# Patient Record
Sex: Female | Born: 1995 | Race: White | Hispanic: No | Marital: Single | Smoking: Never smoker
Health system: Southern US, Community
[De-identification: ages and names within clinical notes are randomized; demographics above are authoritative.]

## PROBLEM LIST (undated history)

## (undated) HISTORY — PX: TYMPANOSTOMY TUBE PLACEMENT: SHX32

---
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
## 2013-03-12 ENCOUNTER — Encounter (INDEPENDENT_AMBULATORY_CARE_PROVIDER_SITE_OTHER): Payer: 59 | Admitting: Family Medicine

## 2013-03-12 DIAGNOSIS — Z23 Encounter for immunization: Secondary | ICD-10-CM

## 2013-03-19 NOTE — Progress Notes (Signed)
Only here for a boostrix injection

## 2013-03-26 NOTE — Progress Notes (Signed)
  Subjective:    Patient ID: Connie Bernard, female    DOB: October 28, 1995, 17 y.o.   MRN: [id number]  HPI    Review of Systems     Objective:   Physical Exam        Assessment & Plan:  Erroneous

## 2013-11-22 ENCOUNTER — Ambulatory Visit (INDEPENDENT_AMBULATORY_CARE_PROVIDER_SITE_OTHER): Payer: 59 | Admitting: General Practice

## 2013-11-22 VITALS — BP 138/74 | HR 63 | Temp 98.4°F | Ht 65.5 in | Wt 175.0 lb

## 2013-11-22 DIAGNOSIS — N390 Urinary tract infection, site not specified: Secondary | ICD-10-CM

## 2013-11-22 DIAGNOSIS — B373 Candidiasis of vulva and vagina: Secondary | ICD-10-CM

## 2013-11-22 DIAGNOSIS — R350 Frequency of micturition: Secondary | ICD-10-CM

## 2013-11-22 DIAGNOSIS — B3731 Acute candidiasis of vulva and vagina: Secondary | ICD-10-CM

## 2013-11-22 LAB — POCT URINALYSIS DIPSTICK
Bilirubin, UA: NEGATIVE
Blood, UA: NEGATIVE
Glucose, UA: NEGATIVE
Ketones, UA: NEGATIVE
Nitrite, UA: NEGATIVE
Protein, UA: NEGATIVE
Spec Grav, UA: 1.025
Urobilinogen, UA: NEGATIVE
pH, UA: 5

## 2013-11-22 LAB — POCT UA - MICROSCOPIC ONLY
Bacteria, U Microscopic: NEGATIVE
Casts, Ur, LPF, POC: NEGATIVE
Crystals, Ur, HPF, POC: NEGATIVE
Mucus, UA: NEGATIVE
RBC, urine, microscopic: NEGATIVE

## 2013-11-22 MED ORDER — FLUCONAZOLE 150 MG PO TABS: 150.0000 mg | ORAL_TABLET | Freq: Once | ORAL | Status: DC

## 2013-11-22 MED ORDER — NITROFURANTOIN MONOHYD MACRO 100 MG PO CAPS: 100.0000 mg | ORAL_CAPSULE | Freq: Two times a day (BID) | ORAL | Status: DC

## 2013-11-22 NOTE — Progress Notes (Signed)
   Subjective:    Patient ID: Connie Bernard, female    DOB: 01/27/1996, 18 y.o.   MRN: [id number]  Urinary Tract Infection  This is a new problem. The current episode started in the past 7 days. The problem has been unchanged. The quality of the pain is described as aching and burning. The pain is at a severity of 4/10. There has been no fever. She is not sexually active. There is no history of pyelonephritis. Associated symptoms include frequency and urgency. Pertinent negatives include no chills or hematuria. There is no history of recurrent UTIs.  Reports last menstrual cycle began 3 weeks ago and regular.     Review of Systems  Constitutional: Negative for fever and chills.  Respiratory: Negative for chest tightness and shortness of breath.   Cardiovascular: Negative for chest pain and palpitations.  Genitourinary: Positive for dysuria, urgency and frequency. Negative for hematuria.       Objective:   Physical Exam  Constitutional: She is oriented to person, place, and time. She appears well-developed and well-nourished.  Cardiovascular: Normal rate, regular rhythm and normal heart sounds.   Pulmonary/Chest: Effort normal and breath sounds normal.  Abdominal: Soft. Bowel sounds are normal. She exhibits no distension. There is tenderness in the suprapubic area. There is no CVA tenderness.  Neurological: She is alert and oriented to person, place, and time.  Skin: Skin is warm and dry.  Psychiatric: She has a normal mood and affect.      Results for orders placed in visit on 11/22/13  POCT UA - MICROSCOPIC ONLY      Result Value Ref Range   WBC, Ur, HPF, POC 5-10     RBC, urine, microscopic neg     Bacteria, U Microscopic neg     Mucus, UA neg     Epithelial cells, urine per micros occ     Crystals, Ur, HPF, POC neg     Casts, Ur, LPF, POC neg     Yeast, UA mod    POCT URINALYSIS DIPSTICK      Result Value Ref Range   Color, UA yellow     Clarity, UA clear     Glucose, UA neg     Bilirubin, UA neg     Ketones, UA neg     Spec Grav, UA 1.025     Blood, UA neg     pH, UA 5.0     Protein, UA neg     Urobilinogen, UA negative     Nitrite, UA neg     Leukocytes, UA moderate (2+)         Assessment & Plan:  1. Urinary frequency  - POCT UA - Microscopic Only - POCT urinalysis dipstick  2. UTI (urinary tract infection)  - nitrofurantoin, macrocrystal-monohydrate, (MACROBID) 100 MG capsule; Take 1 capsule (100 mg total) by mouth 2 (two) times daily.  Dispense: 20 capsule; Refill: 0  3. Vulvovaginal candidiasis  - fluconazole (DIFLUCAN) 150 MG tablet; Take 1 tablet (150 mg total) by mouth once.  Dispense: 1 tablet; Refill: 0 -Increase fluid intake AZO over the counter X2 days Frequent voiding Proper perineal hygiene RTO prn Patient verbalized understanding Coralie KeensMae E. Maronda Caison, FNP-C

## 2013-11-22 NOTE — Patient Instructions (Signed)
Urinary Tract Infection  Urinary tract infections (UTIs) can develop anywhere along your urinary tract. Your urinary tract is your body's drainage system for removing wastes and extra water. Your urinary tract includes two kidneys, two ureters, a bladder, and a urethra. Your kidneys are a pair of bean-shaped organs. Each kidney is about the size of your fist. They are located below your ribs, one on each side of your spine.  CAUSES  Infections are caused by microbes, which are microscopic organisms, including fungi, viruses, and bacteria. These organisms are so small that they can only be seen through a microscope. Bacteria are the microbes that most commonly cause UTIs.  SYMPTOMS   Symptoms of UTIs may vary by age and gender of the patient and by the location of the infection. Symptoms in young women typically include a frequent and intense urge to urinate and a painful, burning feeling in the bladder or urethra during urination. Older women and men are more likely to be tired, shaky, and weak and have muscle aches and abdominal pain. A fever may mean the infection is in your kidneys. Other symptoms of a kidney infection include pain in your back or sides below the ribs, nausea, and vomiting.  DIAGNOSIS  To diagnose a UTI, your caregiver will ask you about your symptoms. Your caregiver also will ask to provide a urine sample. The urine sample will be tested for bacteria and white blood cells. White blood cells are made by your body to help fight infection.  TREATMENT   Typically, UTIs can be treated with medication. Because most UTIs are caused by a bacterial infection, they usually can be treated with the use of antibiotics. The choice of antibiotic and length of treatment depend on your symptoms and the type of bacteria causing your infection.  HOME CARE INSTRUCTIONS   If you were prescribed antibiotics, take them exactly as your caregiver instructs you. Finish the medication even if you feel better after you  have only taken some of the medication.   Drink enough water and fluids to keep your urine clear or pale yellow.   Avoid caffeine, tea, and carbonated beverages. They tend to irritate your bladder.   Empty your bladder often. Avoid holding urine for long periods of time.   Empty your bladder before and after sexual intercourse.   After a bowel movement, women should cleanse from front to back. Use each tissue only once.  SEEK MEDICAL CARE IF:    You have back pain.   You develop a fever.   Your symptoms do not begin to resolve within 3 days.  SEEK IMMEDIATE MEDICAL CARE IF:    You have severe back pain or lower abdominal pain.   You develop chills.   You have nausea or vomiting.   You have continued burning or discomfort with urination.  MAKE SURE YOU:    Understand these instructions.   Will watch your condition.   Will get help right away if you are not doing well or get worse.  Document Released: 06/08/2005 Document Revised: 02/28/2012 Document Reviewed: 10/07/2011  [employer] Patient Information 2014 [employer].

## 2013-11-29 ENCOUNTER — Other Ambulatory Visit: Payer: Self-pay | Admitting: General Practice

## 2015-09-18 DIAGNOSIS — Z3041 Encounter for surveillance of contraceptive pills: Secondary | ICD-10-CM | POA: Diagnosis not present

## 2015-09-18 DIAGNOSIS — Z30431 Encounter for routine checking of intrauterine contraceptive device: Secondary | ICD-10-CM | POA: Diagnosis not present

## 2016-05-03 ENCOUNTER — Encounter: Payer: Self-pay | Admitting: Family Medicine

## 2016-05-03 ENCOUNTER — Ambulatory Visit (INDEPENDENT_AMBULATORY_CARE_PROVIDER_SITE_OTHER): Payer: BLUE CROSS/BLUE SHIELD | Admitting: Family Medicine

## 2016-05-03 DIAGNOSIS — F329 Major depressive disorder, single episode, unspecified: Secondary | ICD-10-CM

## 2016-05-03 DIAGNOSIS — F339 Major depressive disorder, recurrent, unspecified: Secondary | ICD-10-CM | POA: Insufficient documentation

## 2016-05-03 DIAGNOSIS — F418 Other specified anxiety disorders: Secondary | ICD-10-CM

## 2016-05-03 DIAGNOSIS — F419 Anxiety disorder, unspecified: Principal | ICD-10-CM

## 2016-05-03 MED ORDER — ESCITALOPRAM OXALATE 10 MG PO TABS
10.0000 mg | ORAL_TABLET | Freq: Every day | ORAL | 1 refills | Status: DC
Start: 2016-05-03 — End: 2016-05-30

## 2016-05-03 NOTE — Progress Notes (Signed)
BP 135/86 (BP Location: Left Arm, Patient Position: Sitting, Cuff Size: Large)   Pulse (!) 59   Temp 99.6 F (37.6 C) (Oral)   Ht 5' 5.5" (1.664 m)   Wt 198 lb 6.4 oz (90 kg)   LMP 04/28/2016   BMI 32.51 kg/m    Subjective:    Patient ID: Connie Bernard, female    DOB: 10/12/1995, 20 y.o.   MRN: [id number]  HPI: Connie Clasanner E Okelly is a 20 y.o. female presenting on 05/03/2016 for ADD evaluation (has struggled since high school with completing tasks but has worsened since beginning college.  patient is concerned because she has failed a class, is supposed to graduate college this year and is 1-1/2 years behind already.  Mom and brothers have been diagnosed with ADD)   HPI Focus attention and depression issues. Patient is coming in today because she has been having a lot of issues with depression and focus and anxiety since she has been college over the past 3 years. She does not know if his ADHD and focus or if it's her depression but she actually felt a class last semester for the first time and she is concerned and feels like she needs to something to help with it to treat it. Back: She does live in a steroid he and they are very supportive so she feels like she has good friends there. She denies any suicidal ideations or thoughts of hurting herself. She does sleep excessively now has trouble with energy. She says that she also eats excessively and has gained weight and that is part of her issues with the depression as well.  Relevant past medical, surgical, family and social history reviewed and updated as indicated. Interim medical history since our last visit reviewed. Allergies and medications reviewed and updated.  Review of Systems  Constitutional: Positive for unexpected weight change. Negative for chills and fever.  HENT: Negative for congestion, ear discharge and ear pain.   Eyes: Negative for redness and visual disturbance.  Respiratory: Negative for chest tightness and  shortness of breath.   Cardiovascular: Negative for chest pain and leg swelling.  Genitourinary: Negative for difficulty urinating and dysuria.  Musculoskeletal: Negative for back pain and gait problem.  Skin: Negative for rash.  Neurological: Negative for light-headedness and headaches.  Psychiatric/Behavioral: Positive for decreased concentration, dysphoric mood and sleep disturbance. Negative for agitation, behavioral problems, self-injury and suicidal ideas. The patient is nervous/anxious.   All other systems reviewed and are negative.   Per HPI unless specifically indicated above     Medication List       Accurate as of 05/03/16  4:28 PM. Always use your most recent med list.          escitalopram 10 MG tablet Commonly known as:  LEXAPRO Take 1 tablet (10 mg total) by mouth daily.   norethindrone-ethinyl estradiol 1-20 MG-MCG tablet Commonly known as:  JUNEL FE,GILDESS FE,LOESTRIN FE Take 1 tablet by mouth daily.          Objective:    BP 135/86 (BP Location: Left Arm, Patient Position: Sitting, Cuff Size: Large)   Pulse (!) 59   Temp 99.6 F (37.6 C) (Oral)   Ht 5' 5.5" (1.664 m)   Wt 198 lb 6.4 oz (90 kg)   LMP 04/28/2016   BMI 32.51 kg/m   Wt Readings from Last 3 Encounters:  05/03/16 198 lb 6.4 oz (90 kg)  11/22/13 175 lb (79.4 kg) (94 %, Z= 1.52)*   *  Growth percentiles are based on CDC 2-20 Years data.    Physical Exam  Constitutional: She is oriented to person, place, and time. She appears well-developed and well-nourished. No distress.  Eyes: Conjunctivae and EOM are normal. Pupils are equal, round, and reactive to light.  Neck: Neck supple. No thyromegaly present.  Cardiovascular: Normal rate, regular rhythm, normal heart sounds and intact distal pulses.   No murmur heard. Pulmonary/Chest: Effort normal and breath sounds normal. No respiratory distress. She has no wheezes.  Musculoskeletal: Normal range of motion. She exhibits no edema or  tenderness.  Lymphadenopathy:    She has no cervical adenopathy.  Neurological: She is alert and oriented to person, place, and time. Coordination normal.  Skin: Skin is warm and dry. No rash noted. She is not diaphoretic.  Psychiatric: She has a normal mood and affect. Her behavior is normal.  Nursing note and vitals reviewed.     Assessment & Plan:   Problem List Items Addressed This Visit      Other   Anxiety and depression   Relevant Medications   escitalopram (LEXAPRO) 10 MG tablet   Other Relevant Orders   TSH   CBC with Differential/Platelet    Other Visit Diagnoses   None.      Follow up plan: Return in about 4 weeks (around 05/31/2016), or if symptoms worsen or fail to improve, for Well woman exam and Pap, recheck anxiety and depression.  Counseling provided for all of the vaccine components No orders of the defined types were placed in this encounter.   Arville CareJoshua Dettinger, MD [facility] 05/03/2016, 4:28 PM

## 2016-05-04 LAB — CBC WITH DIFFERENTIAL/PLATELET
BASOS: 0 %
Basophils Absolute: 0 10*3/uL (ref 0.0–0.2)
EOS (ABSOLUTE): 0.1 10*3/uL (ref 0.0–0.4)
Eos: 1 %
HEMOGLOBIN: 12.8 g/dL (ref 11.1–15.9)
Hematocrit: 39.1 % (ref 34.0–46.6)
IMMATURE GRANS (ABS): 0 10*3/uL (ref 0.0–0.1)
Immature Granulocytes: 0 %
LYMPHS: 37 %
Lymphocytes Absolute: 2.2 10*3/uL (ref 0.7–3.1)
MCH: 28.9 pg (ref 26.6–33.0)
MCHC: 32.7 g/dL (ref 31.5–35.7)
MCV: 88 fL (ref 79–97)
MONOCYTES: 8 %
Monocytes Absolute: 0.5 10*3/uL (ref 0.1–0.9)
NEUTROS ABS: 3.1 10*3/uL (ref 1.4–7.0)
Neutrophils: 54 %
Platelets: 281 10*3/uL (ref 150–379)
RBC: 4.43 x10E6/uL (ref 3.77–5.28)
RDW: 13.2 % (ref 12.3–15.4)
WBC: 5.9 10*3/uL (ref 3.4–10.8)

## 2016-05-04 LAB — TSH: TSH: 1.17 u[IU]/mL (ref 0.450–4.500)

## 2016-05-30 ENCOUNTER — Ambulatory Visit (INDEPENDENT_AMBULATORY_CARE_PROVIDER_SITE_OTHER): Payer: BLUE CROSS/BLUE SHIELD | Admitting: Family Medicine

## 2016-05-30 ENCOUNTER — Encounter: Payer: Self-pay | Admitting: Family Medicine

## 2016-05-30 VITALS — BP 135/77 | HR 61 | Temp 98.7°F | Ht 65.5 in | Wt 198.4 lb

## 2016-05-30 DIAGNOSIS — F419 Anxiety disorder, unspecified: Secondary | ICD-10-CM

## 2016-05-30 DIAGNOSIS — F329 Major depressive disorder, single episode, unspecified: Secondary | ICD-10-CM

## 2016-05-30 DIAGNOSIS — Z01419 Encounter for gynecological examination (general) (routine) without abnormal findings: Secondary | ICD-10-CM

## 2016-05-30 MED ORDER — VENLAFAXINE HCL ER 37.5 MG PO CP24: 37.5000 mg | ORAL_CAPSULE | Freq: Every day | ORAL | 1 refills | Status: DC

## 2016-05-30 NOTE — Assessment & Plan Note (Addendum)
Lexapro gave her a lot of nausea throughout the month and she doesn't really feel like it helped with her anxiety or depression. It did help with focus. Will try Effexor

## 2016-05-30 NOTE — Progress Notes (Signed)
BP 135/77   Pulse 61   Temp 98.7 F (37.1 C) (Oral)   Ht 5' 5.5" (1.664 m)   Wt 198 lb 6 oz (90 kg)   LMP 05/23/2016 (Approximate)   BMI 32.51 kg/m    Subjective:    Patient ID: Connie Bernard, female    DOB: 01/03/1996, 20 y.o.   MRN: [id number]  HPI: Connie Bernard is a 20 y.o. female presenting on 05/30/2016 for Gynecologic Exam and Followup anxiety and depression   HPI Well woman exam and Pap Patient is coming in today for her well woman exam and Pap smear. She denies any abnormal periods or pelvic pain. She denies any breast nodules or pain in her breast or breast discharge. She denies any chest pain, shortness of breath, headaches or vision issues, abdominal complaints, diarrhea, nausea, vomiting, or joint issues.  Relevant past medical, surgical, family and social history reviewed and updated as indicated. Interim medical history since our last visit reviewed. Allergies and medications reviewed and updated.  Review of Systems  Constitutional: Negative for chills and fever.  HENT: Negative for congestion, ear discharge and ear pain.   Eyes: Negative for redness and visual disturbance.  Respiratory: Negative for chest tightness and shortness of breath.   Cardiovascular: Negative for chest pain and leg swelling.  Gastrointestinal: Negative for abdominal pain, constipation, diarrhea and nausea.  Genitourinary: Negative for difficulty urinating, dysuria, pelvic pain, vaginal bleeding, vaginal discharge and vaginal pain.  Musculoskeletal: Negative for back pain and gait problem.  Skin: Negative for rash.  Neurological: Negative for light-headedness and headaches.  Psychiatric/Behavioral: Positive for decreased concentration and dysphoric mood. Negative for agitation, behavioral problems, self-injury, sleep disturbance and suicidal ideas. The patient is nervous/anxious.   All other systems reviewed and are negative.   Per HPI unless specifically indicated above       Medication List       Accurate as of 05/30/16  2:41 PM. Always use your most recent med list.          norethindrone-ethinyl estradiol 1-20 MG-MCG tablet Commonly known as:  JUNEL FE,GILDESS FE,LOESTRIN FE Take 1 tablet by mouth daily.   venlafaxine XR 37.5 MG 24 hr capsule Commonly known as:  EFFEXOR XR Take 1 capsule (37.5 mg total) by mouth daily with breakfast.          Objective:    BP 135/77   Pulse 61   Temp 98.7 F (37.1 C) (Oral)   Ht 5' 5.5" (1.664 m)   Wt 198 lb 6 oz (90 kg)   LMP 05/23/2016 (Approximate)   BMI 32.51 kg/m   Wt Readings from Last 3 Encounters:  05/30/16 198 lb 6 oz (90 kg)  05/03/16 198 lb 6.4 oz (90 kg)  11/22/13 175 lb (79.4 kg) (94 %, Z= 1.52)*   * Growth percentiles are based on CDC 2-20 Years data.    Physical Exam  Constitutional: She is oriented to person, place, and time. She appears well-developed and well-nourished. No distress.  Eyes: Conjunctivae are normal.  Neck: Neck supple. No thyromegaly present.  Cardiovascular: Normal rate, regular rhythm, normal heart sounds and intact distal pulses.   No murmur heard. Pulmonary/Chest: Effort normal and breath sounds normal. No respiratory distress. She has no wheezes. Right breast exhibits no inverted nipple, no mass (dense breast tissue throughout but no mass), no nipple discharge, no skin change and no tenderness. Left breast exhibits no inverted nipple, no mass (dense breast tissue throughout but no mass),  no nipple discharge, no skin change and no tenderness. Breasts are symmetrical.  Abdominal: Soft. Bowel sounds are normal. She exhibits no distension. There is no tenderness. There is no rebound and no guarding.  Genitourinary: Vagina normal and uterus normal. No breast swelling, tenderness, discharge or bleeding. Pelvic exam was performed with patient supine. There is no rash or lesion on the right labia. There is no rash or lesion on the left labia. Uterus is not deviated, not  enlarged, not fixed and not tender. Cervix exhibits no motion tenderness, no discharge and no friability. Right adnexum displays no mass and no tenderness. Left adnexum displays no mass and no tenderness.  Musculoskeletal: Normal range of motion. She exhibits no edema.  Lymphadenopathy:    She has no cervical adenopathy.    She has no axillary adenopathy.  Neurological: She is alert and oriented to person, place, and time. Coordination normal.  Skin: Skin is warm and dry. No rash noted. She is not diaphoretic.  Psychiatric: She has a normal mood and affect. Her behavior is normal.  Vitals reviewed.     Assessment & Plan:   Problem List Items Addressed This Visit      Other   Anxiety and depression    Lexapro gave her a lot of nausea throughout the month and she doesn't really feel like it helped with her anxiety or depression. It did help with focus. Will try Effexor       Relevant Medications   venlafaxine XR (EFFEXOR XR) 37.5 MG 24 hr capsule    Other Visit Diagnoses    Well woman exam with routine gynecological exam    -  Primary   Relevant Orders   Pap IG, CT/NG w/ reflex HPV when ASC-U      Follow up plan: Return in about 4 weeks (around 06/27/2016), or if symptoms worsen or fail to improve, for Recheck anxiety and depression.  Counseling provided for all of the vaccine components No orders of the defined types were placed in this encounter.   Arville Care, MD [facility] 05/30/2016, 2:41 PM

## 2016-06-03 LAB — PAP IG, CT-NG, RFX HPV ASCU
Chlamydia, Nuc. Acid Amp: NEGATIVE
GONOCOCCUS BY NUCLEIC ACID AMP: NEGATIVE
PAP Smear Comment: 0

## 2016-06-28 ENCOUNTER — Telehealth: Payer: Self-pay | Admitting: Pediatrics

## 2016-06-28 DIAGNOSIS — F419 Anxiety disorder, unspecified: Principal | ICD-10-CM

## 2016-06-28 DIAGNOSIS — F329 Major depressive disorder, single episode, unspecified: Secondary | ICD-10-CM

## 2016-06-28 DIAGNOSIS — F32A Depression, unspecified: Secondary | ICD-10-CM

## 2016-06-28 MED ORDER — VENLAFAXINE HCL ER 37.5 MG PO CP24: 37.5000 mg | ORAL_CAPSULE | Freq: Every day | ORAL | 0 refills | Status: DC

## 2016-06-28 NOTE — Telephone Encounter (Signed)
done

## 2016-07-25 ENCOUNTER — Encounter: Payer: Self-pay | Admitting: Family Medicine

## 2016-07-25 ENCOUNTER — Ambulatory Visit (INDEPENDENT_AMBULATORY_CARE_PROVIDER_SITE_OTHER): Payer: BLUE CROSS/BLUE SHIELD | Admitting: Family Medicine

## 2016-07-25 VITALS — BP 131/84 | HR 67 | Temp 97.8°F | Ht 65.5 in | Wt 189.0 lb

## 2016-07-25 DIAGNOSIS — F418 Other specified anxiety disorders: Secondary | ICD-10-CM | POA: Diagnosis not present

## 2016-07-25 DIAGNOSIS — F419 Anxiety disorder, unspecified: Principal | ICD-10-CM

## 2016-07-25 DIAGNOSIS — F329 Major depressive disorder, single episode, unspecified: Secondary | ICD-10-CM

## 2016-07-25 MED ORDER — VENLAFAXINE HCL ER 75 MG PO CP24: 75.0000 mg | ORAL_CAPSULE | Freq: Every day | ORAL | 3 refills | Status: DC

## 2016-07-25 NOTE — Progress Notes (Signed)
BP 131/84   Pulse 67   Temp 97.8 F (36.6 C) (Oral)   Ht 5' 5.5" (1.664 m)   Wt 189 lb (85.7 kg)   LMP 07/18/2016 (Approximate)   BMI 30.97 kg/m    Subjective:    Patient ID: Connie Bernard, female    DOB: 03/15/1996, 20 y.o.   MRN: [id number]  HPI: Connie Bernard is a 20 y.o. female presenting on 07/25/2016 for Depression (pt here today for follow up on her anxiety and depression, she is doing better on the effexor but feels she may need to increase the dosage)   HPI Anxiety and depression follow-up Patient comes in today for an anxiety and depression follow-up. She feels like she is doing a lot better but maybe not completely there. She still is having some issues with motivation and some issues with anxiety. She does not feel depressed and has no suicidal ideations and is sleeping at night. She says her semester at school is going really well. She would like to try increasing the dose a little bit. She denies any side effects from the medication and is very happy with it.  Relevant past medical, surgical, family and social history reviewed and updated as indicated. Interim medical history since our last visit reviewed. Allergies and medications reviewed and updated.  Review of Systems  Constitutional: Negative for chills and fever.  HENT: Negative for congestion, ear discharge and ear pain.   Eyes: Negative for redness and visual disturbance.  Respiratory: Negative for chest tightness and shortness of breath.   Cardiovascular: Negative for chest pain and leg swelling.  Genitourinary: Negative for difficulty urinating and dysuria.  Musculoskeletal: Negative for back pain and gait problem.  Neurological: Negative for light-headedness and headaches.  Psychiatric/Behavioral: Positive for dysphoric mood. Negative for agitation, behavioral problems, self-injury, sleep disturbance and suicidal ideas. The patient is nervous/anxious.   All other systems reviewed and are  negative.   Per HPI unless specifically indicated above     Medication List       Accurate as of 07/25/16  4:37 PM. Always use your most recent med list.          LO LOESTRIN FE 1 MG-10 MCG / 10 MCG tablet Generic drug:  Norethindrone-Ethinyl Estradiol-Fe Biphas Take 1 tablet by mouth daily.   traMADol 50 MG tablet Commonly known as:  ULTRAM Take 50 mg by mouth every 6 (six) hours as needed.   venlafaxine XR 75 MG 24 hr capsule Commonly known as:  EFFEXOR XR Take 1 capsule (75 mg total) by mouth daily with breakfast.          Objective:    BP 131/84   Pulse 67   Temp 97.8 F (36.6 C) (Oral)   Ht 5' 5.5" (1.664 m)   Wt 189 lb (85.7 kg)   LMP 07/18/2016 (Approximate)   BMI 30.97 kg/m   Wt Readings from Last 3 Encounters:  07/25/16 189 lb (85.7 kg)  05/30/16 198 lb 6 oz (90 kg)  05/03/16 198 lb 6.4 oz (90 kg)    Physical Exam  Constitutional: She is oriented to person, place, and time. She appears well-developed and well-nourished. No distress.  Eyes: Conjunctivae are normal.  Neck: Neck supple. No thyromegaly present.  Cardiovascular: Normal rate, regular rhythm, normal heart sounds and intact distal pulses.   No murmur heard. Pulmonary/Chest: Effort normal and breath sounds normal. No respiratory distress. She has no wheezes.  Lymphadenopathy:    She has no  cervical adenopathy.  Neurological: She is alert and oriented to person, place, and time. Coordination normal.  Skin: Skin is warm and dry. No rash noted. She is not diaphoretic.  Psychiatric: Her behavior is normal. Thought content normal. Her mood appears anxious. She exhibits a depressed mood. She expresses no suicidal ideation. She expresses no suicidal plans.  Nursing note and vitals reviewed.     Assessment & Plan:   Problem List Items Addressed This Visit      Other   Anxiety and depression - Primary   Relevant Medications   venlafaxine XR (EFFEXOR-XR) 75 MG 24 hr capsule       Follow  up plan: Return in about 3 months (around 10/25/2016), or if symptoms worsen or fail to improve, for Follow-up anxiety and depression.  Counseling provided for all of the vaccine components No orders of the defined types were placed in this encounter.   Arville CareJoshua Dettinger, MD [facility] 07/25/2016, 4:37 PM

## 2016-10-20 ENCOUNTER — Ambulatory Visit (INDEPENDENT_AMBULATORY_CARE_PROVIDER_SITE_OTHER): Payer: BLUE CROSS/BLUE SHIELD | Admitting: Family Medicine

## 2016-10-20 ENCOUNTER — Encounter: Payer: Self-pay | Admitting: Family Medicine

## 2016-10-20 VITALS — BP 133/87 | HR 68 | Temp 97.8°F | Ht 65.5 in | Wt 194.4 lb

## 2016-10-20 DIAGNOSIS — Z3041 Encounter for surveillance of contraceptive pills: Secondary | ICD-10-CM | POA: Insufficient documentation

## 2016-10-20 DIAGNOSIS — F419 Anxiety disorder, unspecified: Principal | ICD-10-CM

## 2016-10-20 DIAGNOSIS — F418 Other specified anxiety disorders: Secondary | ICD-10-CM | POA: Diagnosis not present

## 2016-10-20 DIAGNOSIS — F32A Depression, unspecified: Secondary | ICD-10-CM

## 2016-10-20 DIAGNOSIS — F329 Major depressive disorder, single episode, unspecified: Secondary | ICD-10-CM

## 2016-10-20 MED ORDER — VENLAFAXINE HCL ER 75 MG PO CP24: 75.0000 mg | ORAL_CAPSULE | Freq: Every day | ORAL | 2 refills | Status: DC

## 2016-10-20 MED ORDER — LO LOESTRIN FE 1 MG-10 MCG / 10 MCG PO TABS: 1.0000 | ORAL_TABLET | Freq: Every day | ORAL | 9 refills | Status: DC

## 2016-10-20 NOTE — Progress Notes (Signed)
BP 133/87   Pulse 68   Temp 97.8 F (36.6 C) (Oral)   Ht 5' 5.5" (1.664 m)   Wt 194 lb 6.4 oz (88.2 kg)   LMP 09/29/2016   BMI 31.86 kg/m    Subjective:    Patient ID: Connie Bernard, female    DOB: 03/25/1996, 21 y.o.   MRN: [id number]  HPI: Connie Bernard is a 21 y.o. female presenting on 10/20/2016 for Anxiety (3 mos ckup) and Medication Refill (OCP's)   HPI Anxiety and depression recheck Patient is coming in for anxiety and depression recheck. She says she is doing very well on the Effexor and is very happy with the dose and sleeping at 9 denies any suicidal ideations. She says she does not have any anxiety that's keeping her from doing her day-to-day things that she likes to do. She is very happy with her medication.  Birth control refill Patient is coming in for a refill on her birth control. She has been doing well on the birth control and she denies any issues with it. She still uses other forms of contraception as an condoms to protect herself from STDs. She has had multiple different partners in the past year but nothing consistent.  Relevant past medical, surgical, family and social history reviewed and updated as indicated. Interim medical history since our last visit reviewed. Allergies and medications reviewed and updated.  Review of Systems  Constitutional: Negative for chills and fever.  Respiratory: Negative for chest tightness and shortness of breath.   Cardiovascular: Negative for chest pain and leg swelling.  Genitourinary: Negative for difficulty urinating and dysuria.  Musculoskeletal: Negative for back pain and gait problem.  Skin: Negative for rash.  Neurological: Negative for light-headedness and headaches.  Psychiatric/Behavioral: Negative for agitation, behavioral problems, self-injury, sleep disturbance and suicidal ideas. The patient is nervous/anxious.   All other systems reviewed and are negative.  Per HPI unless specifically indicated  above     Objective:    BP 133/87   Pulse 68   Temp 97.8 F (36.6 C) (Oral)   Ht 5' 5.5" (1.664 m)   Wt 194 lb 6.4 oz (88.2 kg)   LMP 09/29/2016   BMI 31.86 kg/m   Wt Readings from Last 3 Encounters:  10/20/16 194 lb 6.4 oz (88.2 kg)  07/25/16 189 lb (85.7 kg)  05/30/16 198 lb 6 oz (90 kg)    Physical Exam  Constitutional: She is oriented to person, place, and time. She appears well-developed and well-nourished. No distress.  Eyes: Conjunctivae are normal.  Cardiovascular: Normal rate, regular rhythm, normal heart sounds and intact distal pulses.   No murmur heard. Pulmonary/Chest: Effort normal and breath sounds normal. No respiratory distress. She has no wheezes.  Musculoskeletal: Normal range of motion. She exhibits no edema or tenderness.  Neurological: She is alert and oriented to person, place, and time. Coordination normal.  Skin: Skin is warm and dry. No rash noted. She is not diaphoretic.  Psychiatric: Her behavior is normal. Judgment and thought content normal. Her mood appears anxious. She does not exhibit a depressed mood. She expresses no suicidal ideation. She expresses no suicidal plans.  Nursing note and vitals reviewed.     Assessment & Plan:   Problem List Items Addressed This Visit      Other   Anxiety and depression - Primary   Relevant Medications   venlafaxine XR (EFFEXOR-XR) 75 MG 24 hr capsule   Encounter for surveillance of contraceptive pills  Relevant Medications   LO LOESTRIN FE 1 MG-10 MCG / 10 MCG tablet       Follow up plan: Return in about 6 months (around 04/19/2017), or if symptoms worsen or fail to improve, for Recheck anxiety and well woman exam.  Counseling provided for all of the vaccine components No orders of the defined types were placed in this encounter.   Arville Care, MD [facility] 10/20/2016, 3:45 PM

## 2016-10-26 ENCOUNTER — Ambulatory Visit: Payer: BLUE CROSS/BLUE SHIELD | Admitting: Family Medicine

## 2016-11-16 ENCOUNTER — Other Ambulatory Visit: Payer: Self-pay | Admitting: Family Medicine

## 2016-11-16 DIAGNOSIS — F329 Major depressive disorder, single episode, unspecified: Secondary | ICD-10-CM

## 2016-11-16 DIAGNOSIS — F419 Anxiety disorder, unspecified: Principal | ICD-10-CM

## 2016-11-16 DIAGNOSIS — F32A Depression, unspecified: Secondary | ICD-10-CM

## 2017-02-09 ENCOUNTER — Other Ambulatory Visit: Payer: Self-pay | Admitting: Family Medicine

## 2017-02-09 DIAGNOSIS — F329 Major depressive disorder, single episode, unspecified: Secondary | ICD-10-CM

## 2017-02-09 DIAGNOSIS — F419 Anxiety disorder, unspecified: Principal | ICD-10-CM

## 2017-02-09 DIAGNOSIS — F32A Depression, unspecified: Secondary | ICD-10-CM

## 2017-02-09 NOTE — Telephone Encounter (Signed)
Medication sent to pharmacy  

## 2017-05-19 ENCOUNTER — Ambulatory Visit: Payer: BLUE CROSS/BLUE SHIELD | Admitting: Family Medicine

## 2017-05-26 ENCOUNTER — Ambulatory Visit: Payer: BLUE CROSS/BLUE SHIELD | Admitting: Family Medicine

## 2017-05-30 ENCOUNTER — Other Ambulatory Visit: Payer: Self-pay | Admitting: Pediatrics

## 2017-05-30 DIAGNOSIS — F419 Anxiety disorder, unspecified: Principal | ICD-10-CM

## 2017-05-30 DIAGNOSIS — F32A Depression, unspecified: Secondary | ICD-10-CM

## 2017-05-30 DIAGNOSIS — F329 Major depressive disorder, single episode, unspecified: Secondary | ICD-10-CM

## 2017-05-30 NOTE — Telephone Encounter (Signed)
Last seen 10/20/16

## 2017-07-05 ENCOUNTER — Other Ambulatory Visit: Payer: Self-pay | Admitting: Pediatrics

## 2017-07-05 DIAGNOSIS — F329 Major depressive disorder, single episode, unspecified: Secondary | ICD-10-CM

## 2017-07-05 DIAGNOSIS — F419 Anxiety disorder, unspecified: Principal | ICD-10-CM

## 2017-08-03 ENCOUNTER — Other Ambulatory Visit: Payer: Self-pay | Admitting: Family Medicine

## 2017-08-03 DIAGNOSIS — Z3041 Encounter for surveillance of contraceptive pills: Secondary | ICD-10-CM

## 2017-08-12 ENCOUNTER — Other Ambulatory Visit: Payer: Self-pay | Admitting: Pediatrics

## 2017-08-12 DIAGNOSIS — Z3041 Encounter for surveillance of contraceptive pills: Secondary | ICD-10-CM

## 2017-08-14 MED ORDER — LO LOESTRIN FE 1 MG-10 MCG / 10 MCG PO TABS: 1.0000 | ORAL_TABLET | Freq: Every day | ORAL | 0 refills | Status: DC

## 2017-08-14 NOTE — Telephone Encounter (Signed)
Please advise 

## 2017-08-14 NOTE — Telephone Encounter (Signed)
Patient notified that rx sent to pharmacy and that she would need an appt for future refills

## 2017-08-14 NOTE — Telephone Encounter (Signed)
OK to send in 90 days with no refills, needs to be seen for any further refills. Not sure which [facility] in [place]?

## 2017-08-28 ENCOUNTER — Encounter: Payer: Self-pay | Admitting: Family Medicine

## 2017-08-28 ENCOUNTER — Ambulatory Visit (INDEPENDENT_AMBULATORY_CARE_PROVIDER_SITE_OTHER): Payer: BLUE CROSS/BLUE SHIELD | Admitting: Family Medicine

## 2017-08-28 VITALS — BP 102/64 | HR 70 | Temp 98.1°F | Ht 65.5 in | Wt 181.0 lb

## 2017-08-28 DIAGNOSIS — F339 Major depressive disorder, recurrent, unspecified: Secondary | ICD-10-CM | POA: Diagnosis not present

## 2017-08-28 DIAGNOSIS — Z3041 Encounter for surveillance of contraceptive pills: Secondary | ICD-10-CM

## 2017-08-28 MED ORDER — VENLAFAXINE HCL ER 37.5 MG PO CP24: 37.5000 mg | ORAL_CAPSULE | Freq: Every day | ORAL | 0 refills | Status: DC

## 2017-08-28 MED ORDER — ETONOGESTREL-ETHINYL ESTRADIOL 0.12-0.015 MG/24HR VA RING: VAGINAL_RING | VAGINAL | 12 refills | Status: DC

## 2017-08-28 MED ORDER — SERTRALINE HCL 50 MG PO TABS: 50.0000 mg | ORAL_TABLET | Freq: Every day | ORAL | 1 refills | Status: DC

## 2017-08-28 NOTE — Progress Notes (Signed)
BP 102/64   Pulse 70   Temp 98.1 F (36.7 C) (Oral)   Ht 5' 5.5" (1.664 m)   Wt 181 lb (82.1 kg)   BMI 29.66 kg/m    Subjective:    Patient ID: Connie Clasanner E Ginger, female    DOB: 04/10/1996, 21 y.o.   MRN: [id number]  HPI: Connie Bernard is a 21 y.o. female presenting on 08/28/2017 for Anxiety (follow up; would like to change meds) and Change birth control (is having abnormal bleeding on Lo Loestrin FE, would like to change to another type)   HPI Anxiety and depression follow-up Patient is coming in for recheck of anxiety and depression today.  She feels like the medication is controlling her depression and controlling her anxiety but it also makes her flat and she feels like she does not have any emotion anymore like this.  We had tried the 37.5 mg previously and it was insufficient to treat her depression so she would like to try something different at this point.  She denies any suicidal ideations or thoughts of hurting herself. [facility] 08/28/2017 10/20/2016 10/20/2016 07/25/2016 05/30/2016  Decreased Interest 2 0 0 1 1  Down, Depressed, Hopeless 0 0 0 1 0  PHQ - 2 Score 2 0 0 2 1  Altered sleeping 1 0 - 1 -  Tired, decreased energy 0 0 - 1 -  Change in appetite 0 3 - 1 -  Feeling bad or failure about yourself  0 0 - 0 -  Trouble concentrating 1 0 - 1 -  Moving slowly or fidgety/restless 0 0 - 0 -  Suicidal thoughts 0 0 - 0 -  PHQ-9 Score 4 3 - 6 -  Difficult doing work/chores Not difficult at all - - - -    Birth control counseling maintenance Patient is coming in to discuss other options for birth control.  She is currently on both Loestrin she has been really focusing on taking it at the same time every day and she says she is still having breakthrough bleeding and has spotting about twice a month.  She does not have heavy periods but would like to discuss other options.  She just started on her spotting and bleeding 2 days ago and she is a week and a half into  the new pill pack.  She denies any vaginal discharge or irritation or urinary issues.  Relevant past medical, surgical, family and social history reviewed and updated as indicated. Interim medical history since our last visit reviewed. Allergies and medications reviewed and updated.  Review of Systems  Constitutional: Negative for chills and fever.  Eyes: Negative for visual disturbance.  Respiratory: Negative for chest tightness and shortness of breath.   Cardiovascular: Negative for chest pain and leg swelling.  Gastrointestinal: Negative for abdominal pain.  Genitourinary: Positive for menstrual problem and vaginal bleeding. Negative for difficulty urinating, dysuria, frequency, genital sores, pelvic pain, vaginal discharge and vaginal pain.  Musculoskeletal: Negative for back pain and gait problem.  Skin: Negative for rash.  Neurological: Negative for light-headedness and headaches.  Psychiatric/Behavioral: Positive for dysphoric mood. Negative for agitation, behavioral problems, self-injury, sleep disturbance and suicidal ideas. The patient is nervous/anxious.   All other systems reviewed and are negative.   Per HPI unless specifically indicated above     Objective:    BP 102/64   Pulse 70   Temp 98.1 F (36.7 C) (Oral)   Ht 5' 5.5" (1.664 m)  Wt 181 lb (82.1 kg)   BMI 29.66 kg/m   Wt Readings from Last 3 Encounters:  08/28/17 181 lb (82.1 kg)  10/20/16 194 lb 6.4 oz (88.2 kg)  07/25/16 189 lb (85.7 kg)    Physical Exam  Constitutional: She is oriented to person, place, and time. She appears well-developed and well-nourished. No distress.  Eyes: Conjunctivae are normal.  Neck: Neck supple. No thyromegaly present.  Cardiovascular: Normal rate, regular rhythm, normal heart sounds and intact distal pulses.  No murmur heard. Pulmonary/Chest: Effort normal and breath sounds normal. No respiratory distress. She has no wheezes. She has no rales.  Musculoskeletal: Normal  range of motion. She exhibits no edema.  Lymphadenopathy:    She has no cervical adenopathy.  Neurological: She is alert and oriented to person, place, and time. Coordination normal.  Skin: Skin is warm and dry. No rash noted. She is not diaphoretic.  Psychiatric: Her behavior is normal. Judgment normal. Her mood appears anxious. She exhibits a depressed mood. She expresses no suicidal ideation. She expresses no suicidal plans.  Nursing note and vitals reviewed.      Assessment & Plan:   Problem List Items Addressed This Visit      Other   Depression, recurrent (HCC) - Primary   Relevant Medications   venlafaxine XR (EFFEXOR XR) 37.5 MG 24 hr capsule   sertraline (ZOLOFT) 50 MG tablet   Encounter for surveillance of contraceptive pills   Relevant Medications   etonogestrel-ethinyl estradiol (NUVARING) 0.12-0.015 MG/24HR vaginal ring       Follow up plan: Return in about 5 weeks (around 10/02/2017), or if symptoms worsen or fail to improve, for Depression recheck.  Counseling provided for all of the vaccine components No orders of the defined types were placed in this encounter.   Arville CareJoshua Deacon Gadbois, MD [facility] 08/28/2017, 1:45 PM

## 2017-10-02 ENCOUNTER — Ambulatory Visit: Payer: BLUE CROSS/BLUE SHIELD | Admitting: Family Medicine

## 2017-10-02 ENCOUNTER — Encounter: Payer: Self-pay | Admitting: Family Medicine

## 2017-10-02 ENCOUNTER — Ambulatory Visit (INDEPENDENT_AMBULATORY_CARE_PROVIDER_SITE_OTHER): Payer: Managed Care, Other (non HMO) | Admitting: Family Medicine

## 2017-10-02 VITALS — BP 108/57 | HR 70 | Temp 97.1°F | Ht 65.5 in | Wt 188.2 lb

## 2017-10-02 DIAGNOSIS — F339 Major depressive disorder, recurrent, unspecified: Secondary | ICD-10-CM | POA: Diagnosis not present

## 2017-10-02 MED ORDER — SERTRALINE HCL 50 MG PO TABS: 50.0000 mg | ORAL_TABLET | Freq: Every day | ORAL | 3 refills | Status: DC

## 2017-10-02 NOTE — Progress Notes (Signed)
BP (!) 108/57   Pulse 70   Temp (!) 97.1 F (36.2 C) (Oral)   Ht 5' 5.5" (1.664 m)   Wt 188 lb 4 oz (85.4 kg)   BMI 30.85 kg/m    Subjective:    Patient ID: Connie Bernard, female    DOB: 12/19/1995, 22 y.o.   MRN: [id number]  HPI: Connie Bernard is a 22 y.o. female presenting on 10/02/2017 for Anxiety/depression (follow up)   HPI Anxiety and depression recheck Patient is coming in for anxiety and depression recheck today.  She says that she has been doing very well on the Zoloft and feels very happy about things and is started back into school this semester and back at her job and she feels like everything is going great.  She denies any suicidal ideations or thoughts of hurting herself.  She denies any major anxiety or feelings of depression.  She is able to study well for her classes and is being more social. Depression screen [facility] 10/02/2017 08/28/2017 10/20/2016 10/20/2016 07/25/2016  Decreased Interest 0 2 0 0 1  Down, Depressed, Hopeless 0 0 0 0 1  PHQ - 2 Score 0 2 0 0 2  Altered sleeping - 1 0 - 1  Tired, decreased energy - 0 0 - 1  Change in appetite - 0 3 - 1  Feeling bad or failure about yourself  - 0 0 - 0  Trouble concentrating - 1 0 - 1  Moving slowly or fidgety/restless - 0 0 - 0  Suicidal thoughts - 0 0 - 0  PHQ-9 Score - 4 3 - 6  Difficult doing work/chores - Not difficult at all - - -     Relevant past medical, surgical, family and social history reviewed and updated as indicated. Interim medical history since our last visit reviewed. Allergies and medications reviewed and updated.  Review of Systems  Constitutional: Negative for chills and fever.  Eyes: Negative for visual disturbance.  Respiratory: Negative for chest tightness and shortness of breath.   Cardiovascular: Negative for chest pain and leg swelling.  Skin: Negative for rash.  Neurological: Negative for light-headedness and headaches.  Psychiatric/Behavioral: Negative for agitation,  behavioral problems, decreased concentration, dysphoric mood, self-injury, sleep disturbance and suicidal ideas. The patient is not nervous/anxious.   All other systems reviewed and are negative.   Per HPI unless specifically indicated above     Objective:    BP (!) 108/57   Pulse 70   Temp (!) 97.1 F (36.2 C) (Oral)   Ht 5' 5.5" (1.664 m)   Wt 188 lb 4 oz (85.4 kg)   BMI 30.85 kg/m   Wt Readings from Last 3 Encounters:  10/02/17 188 lb 4 oz (85.4 kg)  08/28/17 181 lb (82.1 kg)  10/20/16 194 lb 6.4 oz (88.2 kg)    Physical Exam  Constitutional: She is oriented to person, place, and time. She appears well-developed and well-nourished. No distress.  Eyes: Conjunctivae are normal.  Neck: Neck supple. No thyromegaly present.  Cardiovascular: Normal rate, regular rhythm, normal heart sounds and intact distal pulses.  No murmur heard. Pulmonary/Chest: Effort normal and breath sounds normal. No respiratory distress. She has no wheezes. She has no rales.  Musculoskeletal: Normal range of motion.  Lymphadenopathy:    She has no cervical adenopathy.  Neurological: She is alert and oriented to person, place, and time. Coordination normal.  Skin: Skin is warm and dry. No rash noted. She is not  diaphoretic.  Psychiatric: Her behavior is normal. Judgment normal. Her mood appears anxious. She exhibits a depressed mood. She expresses no suicidal ideation. She expresses no suicidal plans.  Nursing note and vitals reviewed.     Assessment & Plan:   Problem List Items Addressed This Visit      Other   Depression, recurrent (HCC) - Primary   Relevant Medications   sertraline (ZOLOFT) 50 MG tablet       Follow up plan: Return in about 3 months (around 12/31/2017), or if symptoms worsen or fail to improve.  Counseling provided for all of the vaccine components No orders of the defined types were placed in this encounter.   Arville Care, MD [facility] 10/02/2017, 11:26 AM

## 2017-12-11 ENCOUNTER — Telehealth: Payer: Self-pay | Admitting: Pediatrics

## 2017-12-11 NOTE — Telephone Encounter (Signed)
Pt aware - 03/12/13 Copy of ncir print out mailed to her

## 2018-01-01 ENCOUNTER — Encounter: Payer: Self-pay | Admitting: Family Medicine

## 2018-01-01 ENCOUNTER — Ambulatory Visit (INDEPENDENT_AMBULATORY_CARE_PROVIDER_SITE_OTHER): Payer: Managed Care, Other (non HMO) | Admitting: Family Medicine

## 2018-01-01 VITALS — BP 131/83 | HR 51 | Temp 98.7°F | Ht 65.5 in | Wt 177.0 lb

## 2018-01-01 DIAGNOSIS — F339 Major depressive disorder, recurrent, unspecified: Secondary | ICD-10-CM | POA: Diagnosis not present

## 2018-01-01 DIAGNOSIS — Z3041 Encounter for surveillance of contraceptive pills: Secondary | ICD-10-CM

## 2018-01-01 MED ORDER — ETONOGESTREL-ETHINYL ESTRADIOL 0.12-0.015 MG/24HR VA RING: VAGINAL_RING | VAGINAL | 3 refills | Status: DC

## 2018-01-01 MED ORDER — SERTRALINE HCL 50 MG PO TABS: 50.0000 mg | ORAL_TABLET | Freq: Every day | ORAL | 3 refills | Status: DC

## 2018-01-01 NOTE — Progress Notes (Signed)
BP 131/83   Pulse (!) 51   Temp 98.7 F (37.1 C) (Oral)   Ht 5' 5.5" (1.664 m)   Wt 177 lb (80.3 kg)   BMI 29.01 kg/m    Subjective:    Patient ID: Connie Bernard, female    DOB: 07/10/1996, 22 y.o.   MRN: [id number]  HPI: Connie Bernard is a 22 y.o. female presenting on 01/01/2018 for Depression/anxiety (3 mo)   HPI Depression and anxiety recheck Patient is coming in today for depression and anxiety recheck.  She has been on Zoloft 50 mg and is going through school in [place] and she feels like everything is going very well for her and denies any major mood disorders or anxiety or suicidal ideations.  She only has a few weeks left of school and she is very happy about that.  [facility] 01/01/2018 10/02/2017 08/28/2017 10/20/2016 10/20/2016  Decreased Interest 0 0 2 0 0  Down, Depressed, Hopeless 0 0 0 0 0  PHQ - 2 Score 0 0 2 0 0  Altered sleeping - - 1 0 -  Tired, decreased energy - - 0 0 -  Change in appetite - - 0 3 -  Feeling bad or failure about yourself  - - 0 0 -  Trouble concentrating - - 1 0 -  Moving slowly or fidgety/restless - - 0 0 -  Suicidal thoughts - - 0 0 -  PHQ-9 Score - - 4 3 -  Difficult doing work/chores - - Not difficult at all - -     Birth control refill for NuvaRing, patient is very satisfied with NuvaRing and feels like everything is controlled and wants to continue on it.  She is due for a physical and pelvic exam and will get scheduled for that later in the year.  Relevant past medical, surgical, family and social history reviewed and updated as indicated. Interim medical history since our last visit reviewed. Allergies and medications reviewed and updated.  Review of Systems  Constitutional: Negative for chills and fever.  Eyes: Negative for visual disturbance.  Respiratory: Negative for chest tightness and shortness of breath.   Cardiovascular: Negative for chest pain and leg swelling.  Musculoskeletal: Negative for back  pain and gait problem.  Skin: Negative for rash.  Neurological: Negative for light-headedness and headaches.  Psychiatric/Behavioral: Negative for agitation, behavioral problems, dysphoric mood, self-injury, sleep disturbance and suicidal ideas. The patient is not nervous/anxious.   All other systems reviewed and are negative.  Per HPI unless specifically indicated above   Allergies as of 01/01/2018   No Known Allergies     Medication List        Accurate as of 01/01/18 11:15 AM. Always use your most recent med list.          etonogestrel-ethinyl estradiol 0.12-0.015 MG/24HR vaginal ring Commonly known as:  NUVARING Insert vaginally and leave in place for 3 consecutive weeks, then remove for 1 week.   sertraline 50 MG tablet Commonly known as:  ZOLOFT Take 1 tablet (50 mg total) by mouth daily.          Objective:    BP 131/83   Pulse (!) 51   Temp 98.7 F (37.1 C) (Oral)   Ht 5' 5.5" (1.664 m)   Wt 177 lb (80.3 kg)   BMI 29.01 kg/m   Wt Readings from Last 3 Encounters:  01/01/18 177 lb (80.3 kg)  10/02/17 188 lb 4 oz (85.4 kg)  08/28/17 181 lb (82.1 kg)    Physical Exam  Constitutional: She is oriented to person, place, and time. She appears well-developed and well-nourished. No distress.  Eyes: Conjunctivae are normal.  Cardiovascular: Normal rate, regular rhythm, normal heart sounds and intact distal pulses.  No murmur heard. Pulmonary/Chest: Effort normal and breath sounds normal. No respiratory distress. She has no wheezes.  Musculoskeletal: Normal range of motion.  Neurological: She is alert and oriented to person, place, and time. Coordination normal.  Skin: Skin is warm and dry. No rash noted. She is not diaphoretic.  Psychiatric: She has a normal mood and affect. Her behavior is normal.  Nursing note and vitals reviewed.       Assessment & Plan:   Problem List Items Addressed This Visit      Other   Depression, recurrent (HCC) - Primary    Relevant Medications   sertraline (ZOLOFT) 50 MG tablet   Encounter for surveillance of contraceptive pills   Relevant Medications   etonogestrel-ethinyl estradiol (NUVARING) 0.12-0.015 MG/24HR vaginal ring       Follow up plan: Return in about 6 months (around 07/03/2018), or if symptoms worsen or fail to improve.  Counseling provided for all of the vaccine components No orders of the defined types were placed in this encounter.   Arville Care, MD [facility] 01/01/2018, 11:15 AM

## 2018-02-16 ENCOUNTER — Encounter: Payer: Self-pay | Admitting: *Deleted

## 2018-07-03 ENCOUNTER — Encounter: Payer: Managed Care, Other (non HMO) | Admitting: Pediatrics

## 2018-07-04 ENCOUNTER — Encounter: Payer: Managed Care, Other (non HMO) | Admitting: Pediatrics

## 2018-07-12 ENCOUNTER — Encounter: Payer: Self-pay | Admitting: Pediatrics

## 2018-07-12 ENCOUNTER — Ambulatory Visit (INDEPENDENT_AMBULATORY_CARE_PROVIDER_SITE_OTHER): Payer: Managed Care, Other (non HMO) | Admitting: Pediatrics

## 2018-07-12 VITALS — BP 122/81 | HR 65 | Temp 98.6°F | Ht 65.5 in | Wt 186.2 lb

## 2018-07-12 DIAGNOSIS — Z0000 Encounter for general adult medical examination without abnormal findings: Secondary | ICD-10-CM | POA: Diagnosis not present

## 2018-07-12 DIAGNOSIS — Z3041 Encounter for surveillance of contraceptive pills: Secondary | ICD-10-CM

## 2018-07-12 DIAGNOSIS — F339 Major depressive disorder, recurrent, unspecified: Secondary | ICD-10-CM

## 2018-07-12 MED ORDER — SERTRALINE HCL 100 MG PO TABS: 100.0000 mg | ORAL_TABLET | Freq: Every day | ORAL | 3 refills | Status: DC

## 2018-07-12 MED ORDER — ETONOGESTREL-ETHINYL ESTRADIOL 0.12-0.015 MG/24HR VA RING: VAGINAL_RING | VAGINAL | 3 refills | Status: DC

## 2018-07-12 NOTE — Progress Notes (Signed)
  Subjective:   Patient ID: Connie Bernard, female    DOB: [birth year], 22 y.o.   MRN: [id number] CC: Annual Exam  HPI: Connie Bernard is a 22 y.o. female   Depression: Mood has been fine on current medication dosing.  No thoughts of self-harm.  Birth control: Currently using NuvaRing.  Has been pleased with it.  Periods have been fairly regular.   Feeling well overall.  No fevers, URI symptoms.  No vaginal discharge.    Pap smear: Up-to-date  Relevant past medical, surgical, family and social history reviewed. Allergies and medications reviewed and updated. Social History   Tobacco Use  Smoking Status Never Smoker  Smokeless Tobacco Never Used   ROS: Per HPI   Objective:    BP 122/81   Pulse 65   Temp 98.6 F (37 C) (Oral)   Ht 5' 5.5" (1.664 m)   Wt 186 lb 3.2 oz (84.5 kg)   BMI 30.51 kg/m   Wt Readings from Last 3 Encounters:  07/12/18 186 lb 3.2 oz (84.5 kg)  01/01/18 177 lb (80.3 kg)  10/02/17 188 lb 4 oz (85.4 kg)    Gen: NAD, alert, cooperative with exam, NCAT EYES: EOMI, no conjunctival injection, or no icterus ENT:  TMs pearly gray b/l, OP without erythema LYMPH: no cervical LAD CV: NRRR, normal S1/S2, no murmur, distal pulses 2+ b/l Resp: CTABL, no wheezes, normal WOB Abd: +BS, soft, NTND. no guarding or organomegaly Ext: No edema, warm Neuro: Alert and oriented, strength equal b/l UE and LE, coordination grossly normal MSK: normal muscle bulk  Assessment & Plan:  Connie Bernard was seen today for annual exam.  Diagnoses and all orders for this visit:  Encounter for preventive health examination  Encounter for surveillance of contraceptive pills Stable, continue NuvaRing.  We will get blood work. -     etonogestrel-ethinyl estradiol (NUVARING) 0.12-0.015 MG/24HR vaginal ring; Insert vaginally and leave in place for 3 consecutive weeks, then remove for 1 week. -     GC/Chlamydia Probe Amp -     CMP14+EGFR -     Cholesterol, total -     HDL  cholesterol -     LDL Cholesterol, Direct -     HIV antibody (with reflex)  Depression, recurrent (HCC) Stable, continue below -     sertraline (ZOLOFT) 100 MG tablet; Take 1 tablet (100 mg total) by mouth daily.   Follow up plan: Return in about 6 months (around 01/10/2019). Assunta Found, MD [place]

## 2018-07-13 LAB — CMP14+EGFR
A/G RATIO: 1.8 (ref 1.2–2.2)
ALK PHOS: 40 IU/L (ref 39–117)
ALT: 11 IU/L (ref 0–32)
AST: 16 IU/L (ref 0–40)
Albumin: 4.8 g/dL (ref 3.5–5.5)
BILIRUBIN TOTAL: 0.3 mg/dL (ref 0.0–1.2)
BUN/Creatinine Ratio: 19 (ref 9–23)
BUN: 15 mg/dL (ref 6–20)
CHLORIDE: 102 mmol/L (ref 96–106)
CO2: 21 mmol/L (ref 20–29)
Calcium: 10 mg/dL (ref 8.7–10.2)
Creatinine, Ser: 0.8 mg/dL (ref 0.57–1.00)
GFR calc Af Amer: 120 mL/min/{1.73_m2} (ref >59)
GFR calc non Af Amer: 104 mL/min/{1.73_m2} (ref >59)
Globulin, Total: 2.6 g/dL (ref 1.5–4.5)
Glucose: 83 mg/dL (ref 65–99)
POTASSIUM: 4.8 mmol/L (ref 3.5–5.2)
Sodium: 139 mmol/L (ref 134–144)
Total Protein: 7.4 g/dL (ref 6.0–8.5)

## 2018-07-13 LAB — LDL CHOLESTEROL, DIRECT: LDL DIRECT: 101 mg/dL — AB (ref 0–99)

## 2018-07-13 LAB — HDL CHOLESTEROL: HDL: 67 mg/dL (ref >39)

## 2018-07-13 LAB — CHOLESTEROL, TOTAL: CHOLESTEROL TOTAL: 187 mg/dL (ref 100–199)

## 2018-07-13 LAB — HIV ANTIBODY (ROUTINE TESTING W REFLEX): HIV Screen 4th Generation wRfx: NONREACTIVE

## 2018-07-14 LAB — GC/CHLAMYDIA PROBE AMP
CHLAMYDIA, DNA PROBE: NEGATIVE
Neisseria gonorrhoeae by PCR: NEGATIVE

## 2019-07-29 ENCOUNTER — Other Ambulatory Visit: Payer: Self-pay

## 2019-08-01 ENCOUNTER — Encounter: Payer: Self-pay | Admitting: Family Medicine

## 2019-08-01 ENCOUNTER — Ambulatory Visit (INDEPENDENT_AMBULATORY_CARE_PROVIDER_SITE_OTHER): Payer: Managed Care, Other (non HMO) | Admitting: Family Medicine

## 2019-08-01 ENCOUNTER — Other Ambulatory Visit: Payer: Self-pay

## 2019-08-01 VITALS — BP 143/97 | HR 85 | Temp 97.7°F | Resp 20 | Ht 65.5 in | Wt 212.0 lb

## 2019-08-01 DIAGNOSIS — F411 Generalized anxiety disorder: Secondary | ICD-10-CM | POA: Diagnosis not present

## 2019-08-01 DIAGNOSIS — F339 Major depressive disorder, recurrent, unspecified: Secondary | ICD-10-CM

## 2019-08-01 DIAGNOSIS — Z3044 Encounter for surveillance of vaginal ring hormonal contraceptive device: Secondary | ICD-10-CM

## 2019-08-01 DIAGNOSIS — R030 Elevated blood-pressure reading, without diagnosis of hypertension: Secondary | ICD-10-CM

## 2019-08-01 DIAGNOSIS — Z6834 Body mass index (BMI) 34.0-34.9, adult: Secondary | ICD-10-CM

## 2019-08-01 MED ORDER — SERTRALINE HCL 100 MG PO TABS: 100.0000 mg | ORAL_TABLET | Freq: Every day | ORAL | 3 refills | Status: AC

## 2019-08-01 MED ORDER — ETONOGESTREL-ETHINYL ESTRADIOL 0.12-0.015 MG/24HR VA RING: VAGINAL_RING | VAGINAL | 3 refills | Status: AC

## 2019-08-01 NOTE — Progress Notes (Signed)
Subjective:  Patient ID: Connie Bernard, female    DOB: [birth year], 23 y.o.   MRN: [id number]  Patient Care Team: Baruch Gouty, FNP as PCP - General (Family Medicine)   Chief Complaint:  Medical Management of Chronic Issues (med check )   HPI: Connie Bernard is a 23 y.o. female presenting on 08/01/2019 for Medical Management of Chronic Issues (med check )   Pt presents today for a refill on her anxiety and depression medications along with her Nuvaring. She has been compliant with all medications without adverse side effects. States she is doing very well on her current dose of sertraline and does not feel a change is necessary. She is very happy with her contraceptive, states it is the best decision she has made. Cycles are normal and she denies side effects from the medication. She is due for an annual physical exam but declines this today, states she will make an appointment to have the completed.    Depression screen [facility] 2/9 08/01/2019 07/12/2018 01/01/2018 10/02/2017 08/28/2017  Decreased Interest 0 1 0 0 2  Down, Depressed, Hopeless 1 1 0 0 0  PHQ - 2 Score 1 2 0 0 2  Altered sleeping 0 1 - - 1  Tired, decreased energy 0 1 - - 0  Change in appetite 1 1 - - 0  Feeling bad or failure about yourself  1 0 - - 0  Trouble concentrating 1 1 - - 1  Moving slowly or fidgety/restless 0 0 - - 0  Suicidal thoughts 0 0 - - 0  PHQ-9 Score 4 6 - - 4  Difficult doing work/chores Somewhat difficult Somewhat difficult - - Not difficult at all   GAD 7 : Generalized Anxiety Score 08/01/2019  Nervous, Anxious, on Edge 1  Control/stop worrying 1  Worry too much - different things 1  Trouble relaxing 0  Restless 0  Easily annoyed or irritable 0  Afraid - awful might happen 0  Total GAD 7 Score 3  Anxiety Difficulty Somewhat difficult       Relevant past medical, surgical, family, and social history reviewed and updated as indicated.  Allergies and medications reviewed and  updated. Date reviewed: Chart in Epic.   History reviewed. No pertinent past medical history.  Past Surgical History:  Procedure Laterality Date  . TYMPANOSTOMY TUBE PLACEMENT      Social History   Socioeconomic History  . Marital status: Single    Spouse name: Not on file  . Number of children: Not on file  . Years of education: Not on file  . Highest education level: Not on file  Occupational History  . Not on file  Social Needs  . Financial resource strain: Not on file  . Food insecurity    Worry: Not on file    Inability: Not on file  . Transportation needs    Medical: Not on file    Non-medical: Not on file  Tobacco Use  . Smoking status: Never Smoker  . Smokeless tobacco: Never Used  Substance and Sexual Activity  . Alcohol use: Yes    Comment: occasional  . Drug use: No  . Sexual activity: Yes    Birth control/protection: Pill  Lifestyle  . Physical activity    Days per week: Not on file    Minutes per session: Not on file  . Stress: Not on file  Relationships  . Social Herbalist on phone: Not  on file    Gets together: Not on file    Attends religious service: Not on file    Active member of club or organization: Not on file    Attends meetings of clubs or organizations: Not on file    Relationship status: Not on file  . Intimate partner violence    Fear of current or ex partner: Not on file    Emotionally abused: Not on file    Physically abused: Not on file    Forced sexual activity: Not on file  Other Topics Concern  . Not on file  Social History Narrative  . Not on file    Outpatient Encounter Medications as of 08/01/2019  Medication Sig  . etonogestrel-ethinyl estradiol (NUVARING) 0.12-0.015 MG/24HR vaginal ring Insert vaginally and leave in place for 3 consecutive weeks, then remove for 1 week.  . sertraline (ZOLOFT) 100 MG tablet Take 1 tablet (100 mg total) by mouth daily.  . [DISCONTINUED] etonogestrel-ethinyl estradiol  (NUVARING) 0.12-0.015 MG/24HR vaginal ring Insert vaginally and leave in place for 3 consecutive weeks, then remove for 1 week.  . [DISCONTINUED] sertraline (ZOLOFT) 100 MG tablet Take 1 tablet (100 mg total) by mouth daily.   No facility-administered encounter medications on file as of 08/01/2019.     No Known Allergies  Review of Systems  Constitutional: Positive for appetite change. Negative for activity change, chills, diaphoresis, fatigue, fever and unexpected weight change.  HENT: Negative.   Eyes: Negative.  Negative for photophobia and visual disturbance.  Respiratory: Negative for cough, chest tightness and shortness of breath.   Cardiovascular: Negative for chest pain, palpitations and leg swelling.  Gastrointestinal: Negative for abdominal pain, blood in stool, constipation, diarrhea, nausea and vomiting.  Endocrine: Negative.  Negative for cold intolerance, heat intolerance, polydipsia, polyphagia and polyuria.  Genitourinary: Negative for decreased urine volume, difficulty urinating, dyspareunia, dysuria, flank pain, frequency, menstrual problem, pelvic pain, urgency, vaginal bleeding, vaginal discharge and vaginal pain.  Musculoskeletal: Negative for arthralgias and myalgias.  Skin: Negative.   Allergic/Immunologic: Negative.   Neurological: Negative for dizziness, tremors, seizures, syncope, facial asymmetry, speech difficulty, weakness, light-headedness, numbness and headaches.  Hematological: Negative.   Psychiatric/Behavioral: Positive for decreased concentration and dysphoric mood. Negative for agitation, behavioral problems, confusion, hallucinations, self-injury, sleep disturbance and suicidal ideas. The patient is nervous/anxious. The patient is not hyperactive.   All other systems reviewed and are negative.       Objective:  BP (!) 143/97   Pulse 85   Temp 97.7 F (36.5 C)   Resp 20   Ht 5' 5.5" (1.664 m)   Wt 212 lb (96.2 kg)   LMP 07/18/2019   SpO2 98%    BMI 34.74 kg/m    Wt Readings from Last 3 Encounters:  08/01/19 212 lb (96.2 kg)  07/12/18 186 lb 3.2 oz (84.5 kg)  01/01/18 177 lb (80.3 kg)    Physical Exam Vitals signs and nursing note reviewed.  Constitutional:      General: She is not in acute distress.    Appearance: Normal appearance. She is well-developed and well-groomed. She is obese. She is not ill-appearing, toxic-appearing or diaphoretic.  HENT:     Head: Normocephalic and atraumatic.     Jaw: There is normal jaw occlusion.     Right Ear: Hearing, tympanic membrane, ear canal and external ear normal.     Left Ear: Hearing, tympanic membrane, ear canal and external ear normal.     Nose: Nose normal.  Mouth/Throat:     Lips: Pink.     Mouth: Mucous membranes are moist.     Pharynx: Oropharynx is clear. Uvula midline.  Eyes:     General: Lids are normal.     Extraocular Movements: Extraocular movements intact.     Conjunctiva/sclera: Conjunctivae normal.     Pupils: Pupils are equal, round, and reactive to light.  Neck:     Musculoskeletal: Normal range of motion and neck supple.     Thyroid: No thyroid mass, thyromegaly or thyroid tenderness.     Vascular: No carotid bruit or JVD.     Trachea: Trachea and phonation normal.  Cardiovascular:     Rate and Rhythm: Normal rate and regular rhythm.     Chest Wall: PMI is not displaced.     Pulses: Normal pulses.     Heart sounds: Normal heart sounds. No murmur. No friction rub. No gallop.   Pulmonary:     Effort: Pulmonary effort is normal. No respiratory distress.     Breath sounds: Normal breath sounds. No wheezing.  Abdominal:     General: Bowel sounds are normal. There is no distension or abdominal bruit.     Palpations: Abdomen is soft. There is no hepatomegaly or splenomegaly.     Tenderness: There is no abdominal tenderness. There is no right CVA tenderness or left CVA tenderness.     Hernia: No hernia is present.  Musculoskeletal: Normal range of motion.      Right lower leg: No edema.     Left lower leg: No edema.  Lymphadenopathy:     Cervical: No cervical adenopathy.  Skin:    General: Skin is warm and dry.     Capillary Refill: Capillary refill takes less than 2 seconds.     Coloration: Skin is not cyanotic, jaundiced or pale.     Findings: No rash.  Neurological:     General: No focal deficit present.     Mental Status: She is alert and oriented to person, place, and time.     Cranial Nerves: Cranial nerves are intact. No cranial nerve deficit.     Sensory: Sensation is intact. No sensory deficit.     Motor: Motor function is intact. No weakness.     Coordination: Coordination is intact. Coordination normal.     Gait: Gait is intact. Gait normal.     Deep Tendon Reflexes: Reflexes are normal and symmetric. Reflexes normal.  Psychiatric:        Attention and Perception: Attention and perception normal.        Mood and Affect: Mood and affect normal.        Speech: Speech normal.        Behavior: Behavior normal. Behavior is cooperative.        Thought Content: Thought content normal. Thought content does not include homicidal or suicidal ideation. Thought content does not include homicidal or suicidal plan.        Cognition and Memory: Cognition and memory normal.        Judgment: Judgment normal.     Results for orders placed or performed in visit on 07/12/18  GC/Chlamydia Probe Amp   Specimen: Urine   UR  Result Value Ref Range   Chlamydia trachomatis, NAA Negative Negative   Neisseria gonorrhoeae by PCR Negative Negative  CMP14+EGFR  Result Value Ref Range   Glucose 83 65 - 99 mg/dL   BUN 15 6 - 20 mg/dL   Creatinine, Ser 0.80 0.57 -  1.00 mg/dL   GFR calc non Af Amer 104 >59 mL/min/1.73   GFR calc Af Amer 120 >59 mL/min/1.73   BUN/Creatinine Ratio 19 9 - 23   Sodium 139 134 - 144 mmol/L   Potassium 4.8 3.5 - 5.2 mmol/L   Chloride 102 96 - 106 mmol/L   CO2 21 20 - 29 mmol/L   Calcium 10.0 8.7 - 10.2 mg/dL    Total Protein 7.4 6.0 - 8.5 g/dL   Albumin 4.8 3.5 - 5.5 g/dL   Globulin, Total 2.6 1.5 - 4.5 g/dL   Albumin/Globulin Ratio 1.8 1.2 - 2.2   Bilirubin Total 0.3 0.0 - 1.2 mg/dL   Alkaline Phosphatase 40 39 - 117 IU/L   AST 16 0 - 40 IU/L   ALT 11 0 - 32 IU/L  Cholesterol, total  Result Value Ref Range   Cholesterol, Total 187 100 - 199 mg/dL  HDL cholesterol  Result Value Ref Range   HDL 67 >39 mg/dL  LDL Cholesterol, Direct  Result Value Ref Range   LDL Direct 101 (H) 0 - 99 mg/dL  HIV antibody (with reflex)  Result Value Ref Range   HIV Screen 4th Generation wRfx Non Reactive Non Reactive       Pertinent labs & imaging results that were available during my care of the patient were reviewed by me and considered in my medical decision making.  Assessment & Plan:  Connie Bernard was seen today for medical management of chronic issues.  Diagnoses and all orders for this visit:  Depression, recurrent ([place]) GAD (generalized anxiety disorder) Doing well on sertraline and would like to continue.  -     sertraline (ZOLOFT) 100 MG tablet; Take 1 tablet (100 mg total) by mouth daily.  Elevated blood pressure reading in office without diagnosis of hypertension DASH diet and exercise encouraged. Will make appointment for CPE with labs.   BMI 34.0-34.9,adult Diet and exercise encouraged.   Encounter for surveillance of vaginal ring hormonal contraceptive device Doing very well with Nuvaring and would like to continue. Has not missed doses, denies pregnancy.  -     etonogestrel-ethinyl estradiol (NUVARING) 0.12-0.015 MG/24HR vaginal ring; Insert vaginally and leave in place for 3 consecutive weeks, then remove for 1 week.     Continue all other maintenance medications.  Follow up plan: Return in about 1 year (around 07/31/2020), or if symptoms worsen or fail to improve, for CPE with PAP.  Continue healthy lifestyle choices, including diet (rich in fruits, vegetables, and lean proteins,  and low in salt and simple carbohydrates) and exercise (at least 30 minutes of moderate physical activity daily).  Educational handout given for depression   The above assessment and management plan was discussed with the patient. The patient verbalized understanding of and has agreed to the management plan. Patient is aware to call the clinic if they develop any new symptoms or if symptoms persist or worsen. Patient is aware when to return to the clinic for a follow-up visit. Patient educated on when it is appropriate to go to the emergency department.   Monia Pouch, FNP-C [facility] [phone]

## 2019-08-01 NOTE — Patient Instructions (Signed)
If your symptoms worsen or you have thoughts of suicide/homicide, PLEASE SEEK IMMEDIATE MEDICAL ATTENTION.  You may always call the National Suicide Hotline.  This is available 24 hours a day, 7 days a week.  Their number is: 1-800-273-8255  Taking the medicine as directed and not missing any doses is one of the best things you can do to treat your depression.  Here are some things to keep in mind:  1) Side effects (stomach upset, some increased anxiety) may happen before you notice a benefit.  These side effects typically go away over time. 2) Changes to your dose of medicine or a change in medication all together is sometimes necessary 3) Most people need to be on medication at least 12 months 4) Many people will notice an improvement within two weeks but the full effect of the medication can take up to 4-6 weeks 5) Stopping the medication when you start feeling better often results in a return of symptoms 6) Never discontinue your medication without contacting a health care professional first.  Some medications require gradual discontinuation/ taper and can make you sick if you stop them abruptly.  If your symptoms worsen or you have thoughts of suicide/homicide, PLEASE SEEK IMMEDIATE MEDICAL ATTENTION.  You may always call:  National Suicide Hotline: 800-273-8255 [place] Crisis Line: 336-832-9700 Crisis Recovery in [place]: 800-939-5911   These are available 24 hours a day, 7 days a week.   

## 2019-08-03 DIAGNOSIS — R030 Elevated blood-pressure reading, without diagnosis of hypertension: Secondary | ICD-10-CM | POA: Insufficient documentation

## 2019-08-12 ENCOUNTER — Ambulatory Visit (INDEPENDENT_AMBULATORY_CARE_PROVIDER_SITE_OTHER): Payer: Managed Care, Other (non HMO) | Admitting: Family

## 2019-08-12 ENCOUNTER — Encounter: Payer: Self-pay | Admitting: Family

## 2019-08-12 DIAGNOSIS — J0110 Acute frontal sinusitis, unspecified: Secondary | ICD-10-CM | POA: Diagnosis not present

## 2019-08-12 MED ORDER — AMOXICILLIN-POT CLAVULANATE 875-125 MG PO TABS: 1.0000 | ORAL_TABLET | Freq: Two times a day (BID) | ORAL | 0 refills | Status: AC

## 2019-08-12 NOTE — Progress Notes (Signed)
   Virtual Visit via telephone Note Due to COVID-19 pandemic this visit was conducted virtually. This visit type was conducted due to national recommendations for restrictions regarding the COVID-19 Pandemic (e.g. social distancing, sheltering in place) in an effort to limit this patient's exposure and mitigate transmission in our community. All issues noted in this document were discussed and addressed.  A physical exam was not performed with this format.  I connected with Connie Bernard on 08/12/19 at 11:15 AM by telephone and verified that I am speaking with the correct person using two identifiers. Connie Bernard is currently located at home and no one is currently with her during visit. The provider, Evelina Dun, FNP is located in their office at time of visit.  I discussed the limitations, risks, security and privacy concerns of performing an evaluation and management service by telephone and the availability of in person appointments. I also discussed with the patient that there may be a patient responsible charge related to this service. The patient expressed understanding and agreed to proceed.   History and Present Illness:  Sinusitis This is a new problem. The current episode started 1 to 4 weeks ago. The problem has been gradually worsening since onset. There has been no fever. Her pain is at a severity of 8/10. The pain is mild. Associated symptoms include congestion, coughing (slightly), ear pain, headaches, a hoarse voice, sinus pressure and sneezing. Pertinent negatives include no chills or sore throat. Past treatments include oral decongestants and lying down. The treatment provided moderate relief.      Review of Systems  Constitutional: Negative for chills.  HENT: Positive for congestion, ear pain, hoarse voice, sinus pressure and sneezing. Negative for sore throat.   Respiratory: Positive for cough (slightly).   Neurological: Positive for headaches.  All other systems  reviewed and are negative.    Observations/Objective: No SOB or distress noted  Assessment and Plan: 1. Acute non-recurrent frontal sinusitis - Take meds as prescribed - Use a cool mist humidifier  -Use saline nose sprays frequently -Force fluids -For any cough or congestion  Use plain Mucinex- regular strength or max strength is fine -For fever or aces or pains- take tylenol or ibuprofen. -Throat lozenges if help -New toothbrush in 3 days RTO if symptoms worsen or do not improve  - amoxicillin-clavulanate (AUGMENTIN) 875-125 MG tablet; Take 1 tablet by mouth 2 (two) times daily.  Dispense: 14 tablet; Refill: 0     I discussed the assessment and treatment plan with the patient. The patient was provided an opportunity to ask questions and all were answered. The patient agreed with the plan and demonstrated an understanding of the instructions.   The patient was advised to call back or seek an in-person evaluation if the symptoms worsen or if the condition fails to improve as anticipated.  The above assessment and management plan was discussed with the patient. The patient verbalized understanding of and has agreed to the management plan. Patient is aware to call the clinic if symptoms persist or worsen. Patient is aware when to return to the clinic for a follow-up visit. Patient educated on when it is appropriate to go to the emergency department.   Time call ended:  11:30 AM  I provided 15 minutes of non-face-to-face time during this encounter.    Evelina Dun, FNP

## 2020-11-06 ENCOUNTER — Ambulatory Visit: Payer: Managed Care, Other (non HMO) | Admitting: Family Medicine
